# Patient Record
Sex: Female | Born: 1960 | Race: White | Hispanic: No | Smoking: Never smoker
Health system: Southern US, Community
[De-identification: ages and names within clinical notes are randomized; demographics above are authoritative.]

---

## 2000-07-02 ENCOUNTER — Other Ambulatory Visit: Admission: RE | Admit: 2000-07-02 | Discharge: 2000-07-02 | Payer: Self-pay | Admitting: Obstetrics and Gynecology

## 2000-09-17 ENCOUNTER — Ambulatory Visit (HOSPITAL_COMMUNITY): Admission: RE | Admit: 2000-09-17 | Discharge: 2000-09-17 | Payer: Self-pay | Admitting: Obstetrics and Gynecology

## 2000-09-17 ENCOUNTER — Encounter: Payer: Self-pay | Admitting: Obstetrics and Gynecology

## 2000-10-25 ENCOUNTER — Inpatient Hospital Stay (HOSPITAL_COMMUNITY): Admission: RE | Admit: 2000-10-25 | Discharge: 2000-10-27 | Payer: Self-pay | Admitting: Obstetrics and Gynecology

## 2000-10-25 ENCOUNTER — Encounter (INDEPENDENT_AMBULATORY_CARE_PROVIDER_SITE_OTHER): Payer: Self-pay | Admitting: *Deleted

## 2001-10-06 ENCOUNTER — Emergency Department (HOSPITAL_COMMUNITY): Admission: EM | Admit: 2001-10-06 | Discharge: 2001-10-06 | Payer: Self-pay | Admitting: Emergency Medicine

## 2003-03-28 ENCOUNTER — Ambulatory Visit (HOSPITAL_COMMUNITY): Admission: RE | Admit: 2003-03-28 | Discharge: 2003-03-28 | Payer: Self-pay | Admitting: Family Medicine

## 2005-09-28 ENCOUNTER — Emergency Department (HOSPITAL_COMMUNITY): Admission: EM | Admit: 2005-09-28 | Discharge: 2005-09-28 | Payer: Self-pay | Admitting: Emergency Medicine

## 2008-06-12 ENCOUNTER — Encounter: Admission: RE | Admit: 2008-06-12 | Discharge: 2008-06-12 | Payer: Self-pay | Admitting: Family Medicine

## 2009-09-20 IMAGING — CR DG RIBS W/ CHEST 3+V*R*
4 series · 4 of 4 positions shown · non-contrast
Comparison: Chest x-ray of 09/28/2005

CLINICAL DATA: Fell, tenderness in the anterior right ribs

RIGHT RIBS AND CHEST - 3+ VIEW

[view not recorded (1 of 4)]
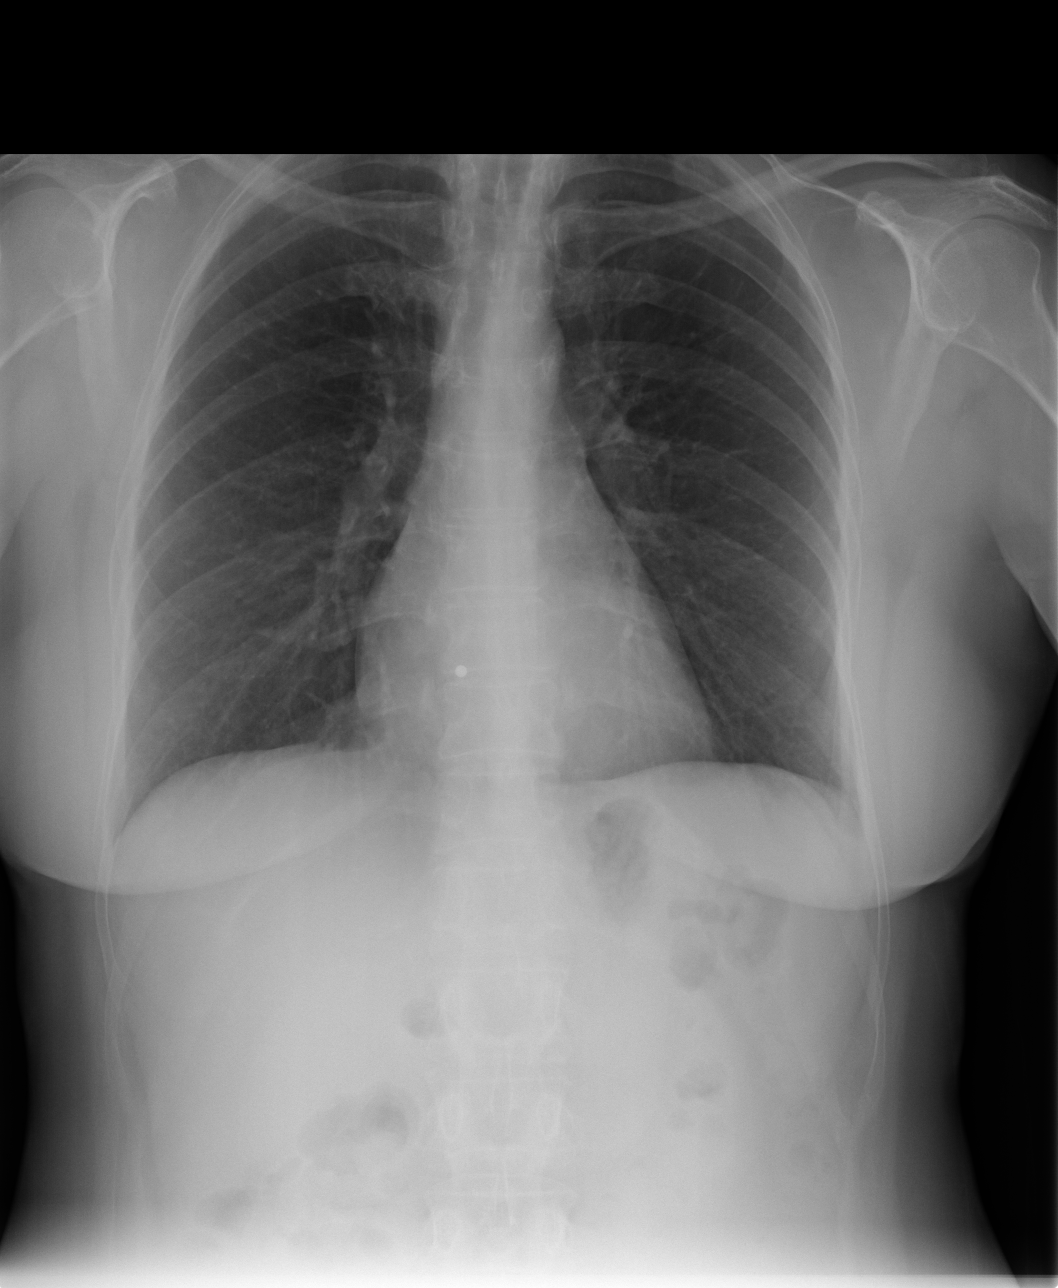

[view not recorded (2 of 4)]
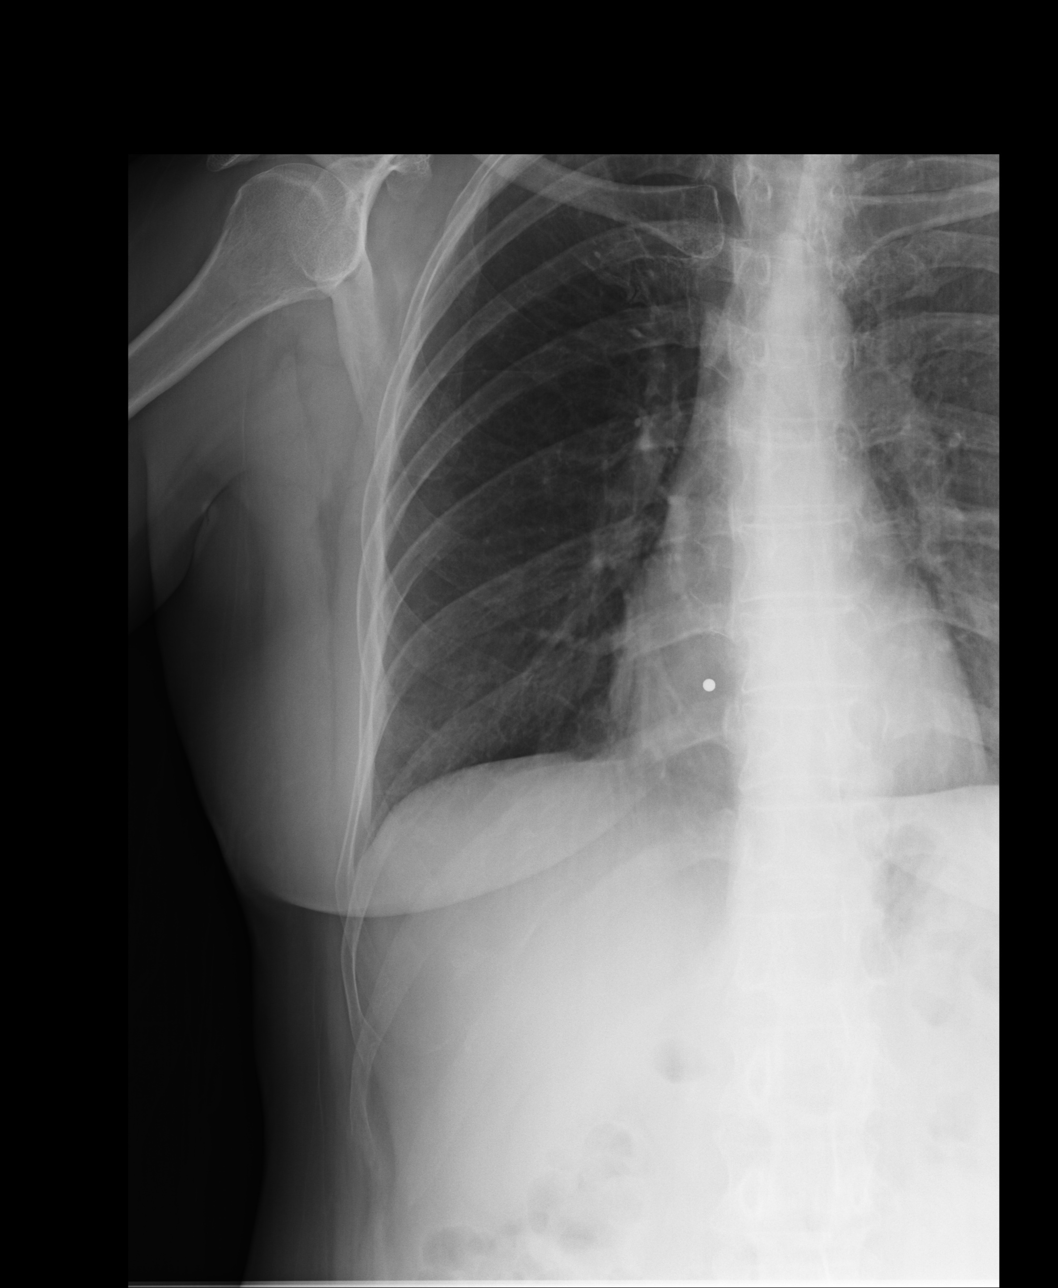

[view not recorded (3 of 4)]
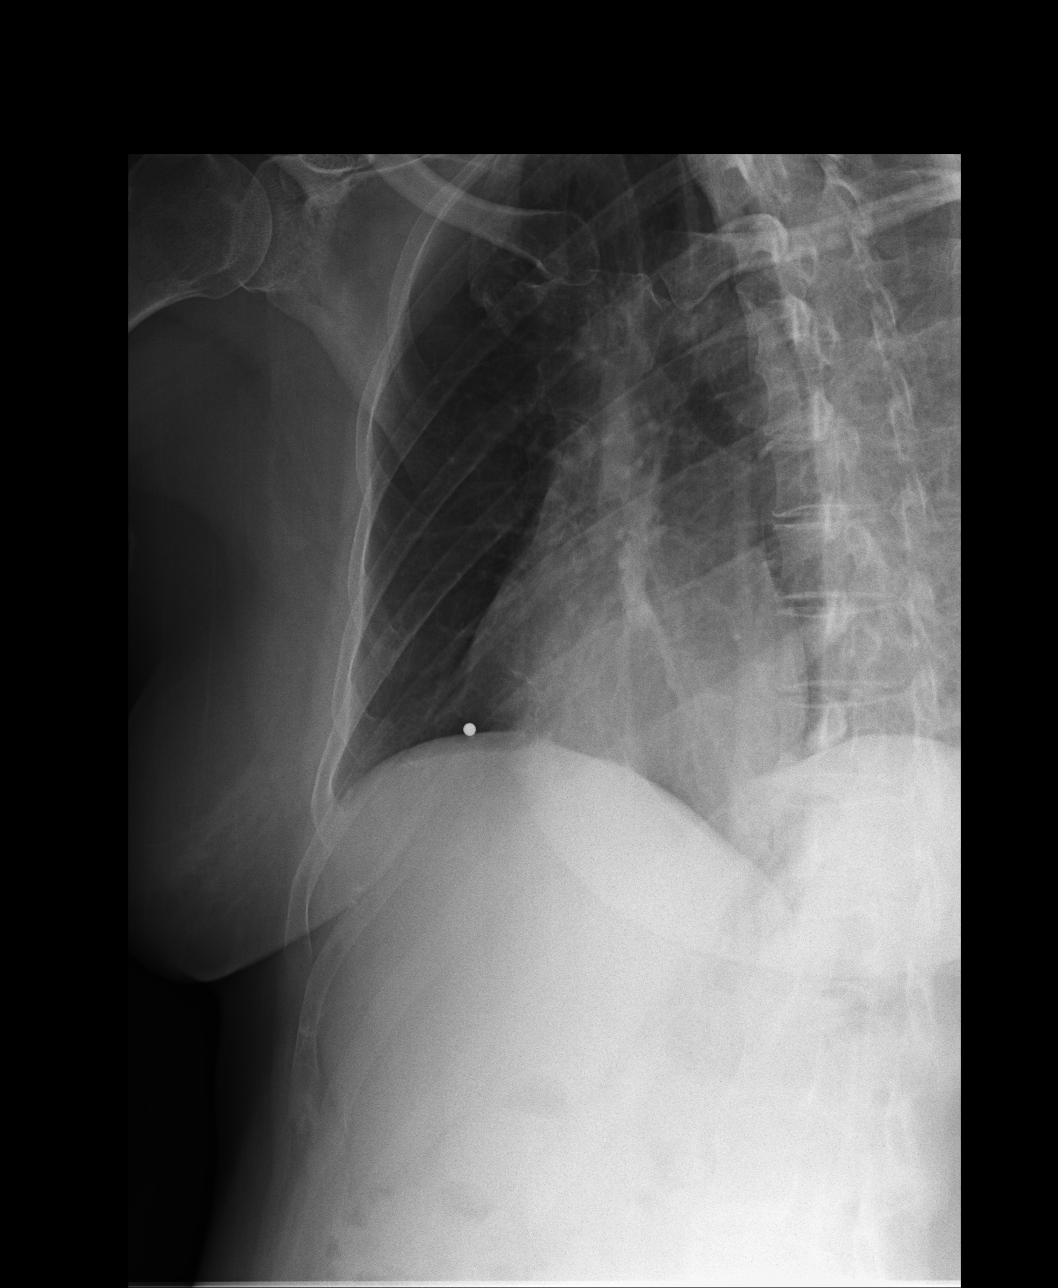

[view not recorded (4 of 4)]
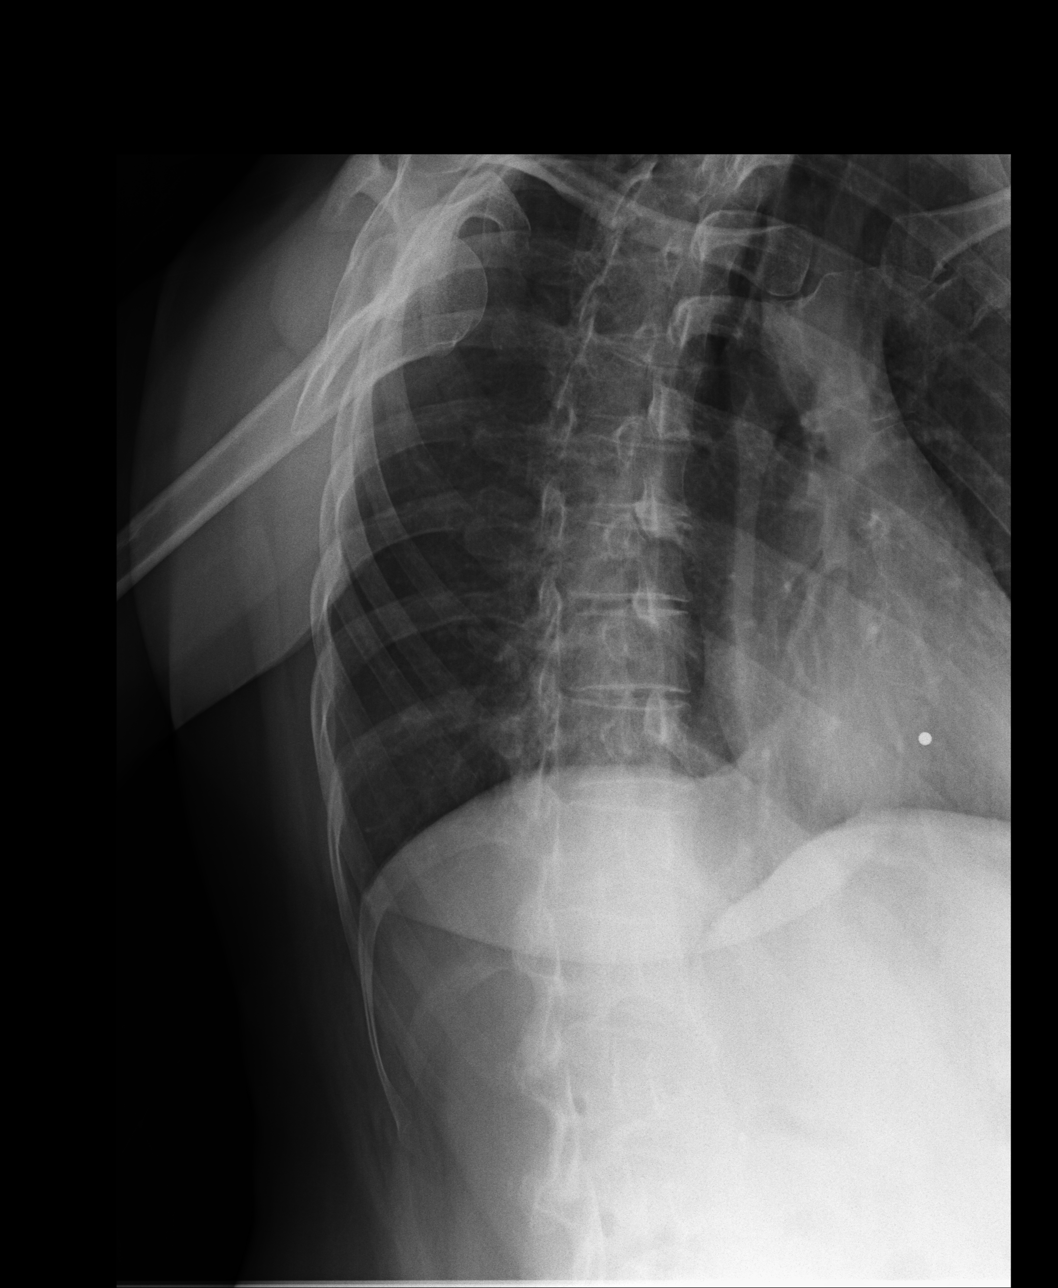

[4 of 4 positions shown; findings below may reference images not displayed]

FINDINGS: No active infiltrate or effusion is seen.  The heart is
within normal limits in size.

A small BB was placed over the area of tenderness.  The BB overlies
the costochondral junction of the very anterior right fifth rib.
No rib fracture is seen.
IMPRESSION: 1.  No active lung disease.
2.  No right rib fracture is seen.

## 2010-07-25 NOTE — Op Note (Signed)
[facility]  Patient:    Caroline Stephenson, Caroline Stephenson Visit Number: [id number] MRN: [id number]          Service Type: GYN Location: 4W 0446 01 Attending Physician:  Malon Kindle Proc. Date: 10/25/00 Adm. 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  ADDITIONAL DIAGNOSIS:  Perineal lesion.  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  OPERATION:  Abdominal hysterectomy, bilateral salpingo-oophorectomy, removal of perineal lesion.  OPERATOR:  Malachi Pro. Ambrose Mantle, M.D.  ASSISTANT:  Alvino Chapel, M.D.  ANESTHESIA:  General.  DESCRIPTION OF PROCEDURE:  The patient was brought to the operating room and placed under satisfactory condition general anesthesia.  The abdomen, vulva, vagina, and urethra were prepped with Betadine solution.  A Foley catheter was inserted to straight drain.  The patient was then placed supine.  The abdomen was draped as a sterile field.  A transverse incision was made through the old scar and carried in layers down through the skin, subcutaneous tissue, and fascia.  The peritoneum was opened vertically.  Exploration of the upper abdomen revealed the liver to be smooth.  I did not feel the gallbladder, but the kidneys felt normal.  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  The uterus itself was anterior, upper limit of normal size.  Both right and left tubes showed evidence of previous tubal ligation.  The proximal portion of the right tube had a little hydrosalpinx.  The right ovary was normal.  The left ovary had some cysts but basically was  normal.  There was no adherence of either ovary to the posterior broad ligament.  The appendix was normal.  Both ureters looked normal.  Packs and retractors were used to prepare the operative field.  The upper pedicles were clamped across.  The round ligaments were bilaterally coagulated with the Bovie, and a bladder flap was developed.  The infundibulopelvic ligaments were then doubly suture ligated after being cut between clamps.  The uterine vessels were skeletonized, clamped, cut, and suture ligated.  The bladder was pushed well inferior to the operative field.  The ureterosacral ligaments were clamped, cut, suture ligated and held, and then the right angle of the vagina was entered, and the uterus and tubes and ovaries were removed by transecting the upper vagina.  Vaginal angled sutures were placed within the central portion of the vagina, and the vagina was closed with interrupted figure-of-eight sutures of 0 Vicryl.  Hemostasis was achieved with suture and Bovie.  Liberal irrigation confirmed hemostasis.  The bladder was filled with 100-200 of methylene blue-stained fluid, and there was no leakage.  Both ureters were identified and were well free of the operative field.  As stated, the uterosacral ligaments were sutured together in the midline, and reperitonealization was done across the vaginal cuff.  At this point, another appendiceal epiploica that appeared infarcted and was still attached to the bowel was removed, and the base was ligated with a 0 Vicryl tie.  All packs and retractors were removed.  The omentum was placed across the pelvis.  The peritoneum was closed with a  running suture of 0 Vicryl.  Rectus muscle was not reapproximated.  The fascia was closed with two running sutures of 0 Vicryl and the subcutaneous with a running 3-0 Vicryl, and the skin was closed with automatic staples.  The patient seemed to tolerate the procedure well and was returned to recovery in  satisfactory condition.  Blood loss was 100-200 cc. DD:  10/25/00 TD:  10/25/00 Job: [id number]

## 2010-07-25 NOTE — Discharge Summary (Signed)
[facility]  Patient:    Caroline Stephenson, Caroline Stephenson Visit Number: [id number] MRN: [id number]          Service Type: GYN Location: 4W 0446 01 Attending Physician:  Malon Kindle Adm. Date:  10/25/2000 Disc. Date: 10/27/2000                             Discharge Summary  HISTORY OF PRESENT ILLNESS:  The patient is a 50 year old white female with dysmenorrhea, menorrhagia, right lower quadrant pain with orgasm and intercourse.  Admitted for abdominal hysterectomy and bilateral salpingo-oophorectomy.  The patient also admitted for removal of a perineal lesion.  HOSPITAL COURSE:  The patient underwent the procedure on 10/26/00, under general anesthesia by Dr. Ambrose Mantle with Dr. Senaida Ores assisting.  Estimated blood loss was about 200 cc.  Postoperatively, the patient did well.  She had no significant problems except for pain in the abdomen.  She did respond with flatus after a suppository on the first postoperative day.  Her abdomen remained soft, slightly distended, but not tender.  Her hematocrit remained stable.  Her blood pressure did tend to drift lower, but it was not from blood loss, and on the second postoperative day she was felt to be a candidate for discharge.  LABORATORY DATA:  White count was 6400, hemoglobin 12.3, hematocrit 35.1, platelet count 273,000.  Followup hematocrits 33.2 and 31.5, 71 segs, 19 lymphs, 7 monos, 3 eosinophils, 1 basophil.  Comprehensive metabolic panel was normal except for a potassium of 3.4.  Urinalysis was negative, 3 to 6 white blood cells, rare bacteria.  Pathology report showed uterus, tubes, and ovaries showed a small leiomyoma of the myometrium, both tubes showed evidence of prior tubal ligation.  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  There was no evidence of endometriosis.  The perineal lesion was a fibroepithelial polyp.  There was no evidence of dysplasia or human papilloma viral  effect.  FINAL DIAGNOSES: 1. Dysmenorrhea unresponsive to medical therapy. 2. Dyspareunia. 3. Menorrhagia. 4. Perineal fibroepithelial polyp.  OPERATION:  Abdominal hysterectomy, bilateral salpingo-oophorectomy, removal of perineal fibroepithelial polyp.  CONDITION ON DISCHARGE:  Improved.  DISCHARGE DIET:  Regular.  ACTIVITY:  No vaginal entrance, no heavy lifting or strenuous activity.  Report any fever greater than 100.4 degrees.  Report any unusual problems.  FOLLOWUP:  She is to return in two days to have her staples removed.  The left side of the incision drained slightly.  One of the staples did not hold the skin edges in apposition.  DISCHARGE MEDICATIONS:  Mepergan Fortis 20 tablets one q.4-6h. p.r.n. pain. Attending Physician:  Malon Kindle DD:  10/27/00 TD:  10/27/00 Job: [id number]

## 2010-07-25 NOTE — Op Note (Signed)
[facility]  Patient:    Caroline Stephenson, Caroline Stephenson Visit Number: [id number] MRN: [id number]          Service Type: GYN Location: 4W 0446 01 Attending Physician:  Malon Kindle Proc. Date: 10/25/00 Adm. Date:  [id number]                             Operative Report  ADDENDUM:  After the abdomen was closed, the patient was placed in frogleg position.  A 1 cm perineal lesion was identified, prepped with Betadine solution, removed with a knife blade, and the skin was reapproximated with a 4-0 Vicryl suture. DD:  10/25/00 TD:  10/25/00 Job: [id number]

## 2010-07-25 NOTE — H&P (Signed)
[facility]  Patient:    Caroline Stephenson, Caroline Stephenson Visit Number: [id number] MRN: [id number]          Service Type: Attending:  Malachi Pro. Ambrose Mantle, M.D. Adm. Date:  10/26/00                           History and Physical  PRESENT ILLNESS:  This is a 50 year old white female, para 2-0-0-2, who is admitted to the hospital for abdominal hysterectomy and bilateral salpingo-oophorectomy because of persistent severe right lower abdominal pain, right lower abdominal dysmenorrhea and pain in her right side with intercourse as well as with orgasm.  Patient gives a history of her last period, October 09, 2000.  Periods usually occur at about every 28-day intervals, last 7 days.  They are very heavy for a couple of days.  She claims to hurt severely in her right lower quadrant on the first day.  Flow is heavy for two to three days and states that she has pain in her right side with intercourse and with organism.  The pain in her right lower quadrant increases three to four days before her periods.  With her last period, she stated that she thought she was going to die because of the pain.  She had been given prescription for Anaprox DS but this is minimally effective.  STD culture was negative in June.  Ultrasound in July was essentially within normal limits. The patient did have pain in her right lower abdomen before she ever had her children.  Diagnostic laparoscopy showed an obstructed right tube.  She did undergo two C-sections, the second of which she had a left tubal ligation and removal of the distal part of the right tube because the fimbria was clubbed. At the present time, she is admitted for abdominal hysterectomy and bilateral salpingo-oophorectomy because of the persistent right lower abdominal pain with intercourse, organism with her periods and before her periods.  She states that she has been out of work one week with her last three periods.  ALLERGIES:   PENICILLIN.  OPERATIONS:  Two C-sections, diagnostic laparoscopy and sinus surgery.  PAST MEDICAL HISTORY:  Illnesses:  Patient did have pneumonia one time but otherwise has been in fairly good health.  She does not take any significant medications.  She has no known heart problems.  SOCIAL HISTORY:  Does not drink or smoke.  The patient is a Buyer, retail of [employer].  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  She claims to have a sex drive six days per week.  REVIEW OF SYSTEMS:  Negative.  FAMILY HISTORY:  Mother 17, diabetic.  Father died at 81 of a house fire. Three sisters, one with diabetes.  Three brothers, one with diabetes.  PHYSICAL EXAMINATION:  VITAL SIGNS:  Blood pressure 110/70, pulse 80.  Weight 122-3/4 pounds.  GENERAL:  Well-developed, slender white female in no distress.  HEENT:  No cranial abnormalities.  Extraocular movements are intact.  Nose and pharynx clear.  NECK:  Supple without thyromegaly.  HEART:  Normal size and sounds.  No murmurs.  LUNGS:  Clear to P&A.  BREASTS:  Soft without masses.  ABDOMEN:  Soft and flat and nontender.  PELVIC:  Pap smear on July 02, 2000 was within normal limits.  The vulva and vagina are clean.  BUS negative.  Cervix clean.  Uterus anterior and normal size.  Adnexa clear.  The patient reports uterine tenderness  on manipulation.  RECTAL:  Exam done in April was negative.  ADMITTING IMPRESSION:  Persistent right lower abdominal pain with dysmenorrhea, dyspareunia and pain with orgasm and menorrhagia.  Patient is admitted for abdominal hysterectomy and bilateral salpingo-oophorectomy.  She understands the risks of surgery include, but are not limited to, heart attack, stroke, pulmonary embolus, wound disruption, hemorrhage with need for reoperation and/or transfusion, fistula formation, nerve injury and intestinal obstruction.  She also understands that there is an unknown effect on her sex drive.   She understands and agrees to proceed. DD:  10/24/00 TD:  10/25/00 Job: [id number]

## 2014-02-20 ENCOUNTER — Ambulatory Visit (INDEPENDENT_AMBULATORY_CARE_PROVIDER_SITE_OTHER): Payer: Federal, State, Local not specified - PPO

## 2014-02-20 VITALS — BP 105/54 | HR 80 | Resp 18

## 2014-02-20 DIAGNOSIS — M7751 Other enthesopathy of right foot: Secondary | ICD-10-CM

## 2014-02-20 DIAGNOSIS — R609 Edema, unspecified: Secondary | ICD-10-CM

## 2014-02-20 DIAGNOSIS — R52 Pain, unspecified: Secondary | ICD-10-CM

## 2014-02-20 DIAGNOSIS — S93401A Sprain of unspecified ligament of right ankle, initial encounter: Secondary | ICD-10-CM

## 2014-02-20 NOTE — Patient Instructions (Signed)

## 2014-02-20 NOTE — Progress Notes (Signed)
   Subjective:    Patient ID: Caroline Stephenson, female    DOB: December 17, 1960, 53 y.o.   MRN: [id number]  HPI on Saturday 02/17/14 i stepped on a root that was covered in leaves and it was burning, throbbing and was swollen and there is no numbness or tingling now    Review of Systems  All other systems reviewed and are negative.      Objective:   Physical Exam 53 year old white female well-developed well-nourished oriented 3 presents at this time with some pain discomfort distress point the lateral right ankle area which is slightly edematous and ecchymotic. Patient indicates injury occurred this past Sunday on 02/17/2014 she stepped or tripped over a tree root causing a rolling of her ankle. Has ecchymosis the lateral malleolar area of anterior and inferior to the malleolus with some slight edema noted. There is pain on palpation and compression of the anterior talofibular ligament and calcaneofibular ligament both anteriorly inferior to the right lateral malleolus. Lower extremity objective findings as follows vascular status is intact pedal pulses palpable DP and PT +2 over 4 capillary fill time 3 seconds epicritic and proprioceptive sensations intact and symmetric bilateral there is normal plantar response and DTRs. Dermatologic the skin color pigment normal hair growth present but is distally nail somewhat criptotic there is ecchymosis and edema lateral sinus tarsi area lateral malleoli are area right. On orthopedic biomechanical exam rectus foot type ankle metatarsal subtalar joint motions normal unremarkable and left the right foot has some guarded motion on inversion and plantarflexion there is tightness on palpation anterior talofibular and calcaneofibular ligament area there is no refusal pain in the posterior malleoli are area x-rays reveal no acute fracture no cyst or tumor no asymmetry or cystic changes in the ankle mortise or within talar dome radiographs otherwise unremarkable      Assessment & Plan:  Assessment lateral ankle sprain right ankle posse primary to secondary sprain with involvement of the anterior talofibular and possibly calcaneal fib ligament they're per no tendons are intact and functioning [place]. Wounds no ulcers no secondary infections are noted plan at this time patient is placed in ankle stabilizer an AFO type brace is applied both lace up and Velcro-type closures utilized to help stabilize the ankle and prevent inversion or eversion. She has full allow her to drive and function. Recommended ice and ibuprofen or Advil as needed for pain or Tylenol as needed for pain and ankle stabilizer is dispensed and applied to the foot at this time to be maintained for at least a 3-4 week duration up to 6 weeks if necessary. Reevaluate in one month if needed may need a note for work indicating she has a lateral right ankle sprain.  Alvan Dameichard Tarrin Lebow DPM

## 2018-08-13 ENCOUNTER — Other Ambulatory Visit: Payer: Self-pay | Admitting: *Deleted

## 2018-08-13 DIAGNOSIS — Z20822 Contact with and (suspected) exposure to covid-19: Secondary | ICD-10-CM

## 2018-08-15 LAB — NOVEL CORONAVIRUS, NAA: SARS-CoV-2, NAA: NOT DETECTED

## 2020-06-18 ENCOUNTER — Other Ambulatory Visit
Admission: RE | Admit: 2020-06-18 | Discharge: 2020-06-18 | Disposition: A | Discharge status: Home / Self Care | Payer: BC Managed Care – PPO | Source: Ambulatory Visit | Attending: Family Medicine | Admitting: Family Medicine

## 2020-06-18 DIAGNOSIS — R079 Chest pain, unspecified: Secondary | ICD-10-CM | POA: Diagnosis present

## 2020-06-18 DIAGNOSIS — R0600 Dyspnea, unspecified: Secondary | ICD-10-CM | POA: Insufficient documentation

## 2020-06-18 LAB — D-DIMER, QUANTITATIVE: D-Dimer, Quant: 0.61 ug/mL-FEU — ABNORMAL HIGH (ref 0.00–0.50)

## 2020-07-09 ENCOUNTER — Other Ambulatory Visit: Payer: Self-pay | Admitting: Internal Medicine

## 2020-07-09 DIAGNOSIS — R0602 Shortness of breath: Secondary | ICD-10-CM

## 2020-07-09 DIAGNOSIS — R079 Chest pain, unspecified: Secondary | ICD-10-CM

## 2020-07-17 ENCOUNTER — Other Ambulatory Visit: Payer: Self-pay

## 2020-07-17 ENCOUNTER — Encounter
Admission: RE | Admit: 2020-07-17 | Discharge: 2020-07-17 | Disposition: A | Discharge status: Home / Self Care | Payer: Federal, State, Local not specified - PPO | Source: Ambulatory Visit | Attending: Internal Medicine | Admitting: Internal Medicine

## 2020-07-17 DIAGNOSIS — R0602 Shortness of breath: Secondary | ICD-10-CM | POA: Diagnosis present

## 2020-07-17 DIAGNOSIS — R079 Chest pain, unspecified: Secondary | ICD-10-CM | POA: Diagnosis present

## 2020-07-17 LAB — NM MYOCAR MULTI W/SPECT W/WALL MOTION / EF
Estimated workload: 1 METS
Exercise duration (sec): 50 s
LV dias vol: 16 mL (ref 46–106)
LV sys vol: 2 mL
MPHR: 158 {beats}/min
Peak HR: 139 {beats}/min
Percent HR: 87 %
Rest HR: 67 {beats}/min
SDS: 2
SRS: 0
SSS: 0
TID: 0.74

## 2020-07-17 MED ORDER — TECHNETIUM TC 99M TETROFOSMIN IV KIT
30.1100 | PACK | Freq: Once | INTRAVENOUS | Status: AC | PRN
  Administered 2020-07-17: 30.11 via INTRAVENOUS

## 2020-07-17 MED ORDER — TECHNETIUM TC 99M TETROFOSMIN IV KIT
9.0900 | PACK | Freq: Once | INTRAVENOUS | Status: AC | PRN
  Administered 2020-07-17: 9.09 via INTRAVENOUS

## 2020-07-17 MED ORDER — REGADENOSON 0.4 MG/5ML IV SOLN
0.4000 mg | Freq: Once | INTRAVENOUS | Status: AC
  Administered 2020-07-17: 0.4 mg via INTRAVENOUS

## 2023-12-01 ENCOUNTER — Other Ambulatory Visit (HOSPITAL_COMMUNITY): Payer: Self-pay

## 2023-12-01 DIAGNOSIS — R053 Chronic cough: Secondary | ICD-10-CM
